# Patient Record
Sex: Male | Born: 1990 | Race: White | Hispanic: No | Marital: Single
Health system: Southern US, Community
[De-identification: ages and names within clinical notes are randomized; demographics above are authoritative.]

---

## 2017-03-06 ENCOUNTER — Emergency Department (HOSPITAL_COMMUNITY): Payer: Self-pay

## 2017-03-06 ENCOUNTER — Emergency Department (HOSPITAL_COMMUNITY)
Admission: EM | Admit: 2017-03-06 | Discharge: 2017-03-06 | Disposition: A | Discharge status: Home / Self Care | Payer: Self-pay | Attending: Emergency Medicine | Admitting: Emergency Medicine

## 2017-03-06 DIAGNOSIS — F17200 Nicotine dependence, unspecified, uncomplicated: Secondary | ICD-10-CM | POA: Insufficient documentation

## 2017-03-06 DIAGNOSIS — R0789 Other chest pain: Secondary | ICD-10-CM

## 2017-03-06 DIAGNOSIS — Z79899 Other long term (current) drug therapy: Secondary | ICD-10-CM | POA: Insufficient documentation

## 2017-03-06 LAB — TYPE AND SCREEN
ABO/RH(D): A POS
ANTIBODY SCREEN: NEGATIVE

## 2017-03-06 LAB — I-STAT TROPONIN, ED: TROPONIN I, POC: 0 ng/mL (ref 0.00–0.08)

## 2017-03-06 LAB — CBC
HCT: 47.1 % (ref 39.0–52.0)
HEMOGLOBIN: 17.3 g/dL — AB (ref 13.0–17.0)
MCH: 36.2 pg — AB (ref 26.0–34.0)
MCHC: 36.7 g/dL — ABNORMAL HIGH (ref 30.0–36.0)
MCV: 98.5 fL (ref 78.0–100.0)
Platelets: 227 10*3/uL (ref 150–400)
RBC: 4.78 MIL/uL (ref 4.22–5.81)
RDW: 11.3 % — ABNORMAL LOW (ref 11.5–15.5)
WBC: 7.4 10*3/uL (ref 4.0–10.5)

## 2017-03-06 LAB — BASIC METABOLIC PANEL
ANION GAP: 14 (ref 5–15)
BUN: <5 mg/dL — ABNORMAL LOW (ref 6–20)
CALCIUM: 9.8 mg/dL (ref 8.9–10.3)
CHLORIDE: 103 mmol/L (ref 101–111)
CO2: 19 mmol/L — AB (ref 22–32)
Creatinine, Ser: 0.75 mg/dL (ref 0.61–1.24)
GFR calc non Af Amer: >60 mL/min (ref >60)
Glucose, Bld: 109 mg/dL — ABNORMAL HIGH (ref 65–99)
Potassium: 3.7 mmol/L (ref 3.5–5.1)
Sodium: 136 mmol/L (ref 135–145)

## 2017-03-06 LAB — ABO/RH: ABO/RH(D): A POS

## 2017-03-06 NOTE — ED Triage Notes (Signed)
Pt states that he has had intermittent chest pain for over 1 week worsening since Saturday. Pt states that he had his last dink of alcohol on Saturday. Pt states that he drinks daily except for Saturday. Pt noted to have pressured speech and trembling his hands in triage. Pt also reports rectal bleeding that has been ongoing for months.

## 2017-03-06 NOTE — Discharge Instructions (Signed)
Call any of the numbers on these instructions to get a primary care physician. Your primary care physician can refer you to a specialist you may need to further investigate rectal bleeding and chest pain. We do not believe that you have a life-threatening cause of rectal bleeding or of chest pain. Blood count is adequate. Ask your primary care physician to help you to stop smoking. Your blood pressure should be rechecked within the next 3 weeks. Today's was elevated at 152/98. If you have an alcohol problem, get help. Call any of the numbers on the resource guide

## 2017-03-06 NOTE — ED Provider Notes (Signed)
[facility]-EMERGENCY DEPT Provider Note   CSN: [id number] Arrival date & time: 03/06/17  1334     History   Chief Complaint Chief Complaint  Patient presents with  . Chest Pain    HPI Jeff Carter is a 26 y.o. male.Complains of chest pain for 8 months. Pain is covering a 1 cm area lateral to the left nipple, nonradiating worse with lying on left side improved with other positions. No treatment prior to coming here no shortness of breath no nausea. Pain is nonexertional. No fever. He also reports rectal bleeding for several months with each stool. Red blood mixed with stool. He denies abdominal pain denies change in appetite. He presently feels well except for slightly nervous. He admits to drinking 2 alcoholic beverages per night 6 days per week. His last alcoholic intake was 2 days ago. No treatment prior to coming here. No other associated symptoms  HPI  No past medical history on file.  There are no active problems to display for this patient.   No past surgical history on file.     Home Medications    Prior to Admission medications   Medication Sig Start Date End Date Taking? Authorizing Provider  calcium carbonate (TUMS - DOSED IN MG ELEMENTAL CALCIUM) 500 MG chewable tablet Chew 2 tablets by mouth daily as needed for indigestion or heartburn.   Yes [provider]    Family History No family history on file.  Social History Social History  Substance Use Topics  . Smoking status: Not on file  . Smokeless tobacco: Not on file  . Alcohol use Not on file   Positive smoker drinks approximately 2 alcoholic beverages per night. No illicit drug use  Allergies   Patient has no known allergies.   Review of Systems Review of Systems  Constitutional: Negative.   HENT: Negative.   Respiratory: Negative.   Cardiovascular: Positive for chest pain.  Gastrointestinal: Positive for blood in stool.  Musculoskeletal: Negative.   Skin: Negative.   Neurological:  Negative.   Psychiatric/Behavioral: The patient is nervous/anxious.   All other systems reviewed and are negative.    Physical Exam Updated Vital Signs BP (!) 152/98   Pulse 94   Temp 98.2 F (36.8 C) (Oral)   Resp 12   SpO2 99%   Physical Exam  Constitutional: He appears well-developed and well-nourished.  HENT:  Head: Normocephalic and atraumatic.  Eyes: Pupils are equal, round, and reactive to light. Conjunctivae are normal.  Neck: Neck supple. No tracheal deviation present. No thyromegaly present.  Cardiovascular: Normal rate and regular rhythm.   No murmur heard. Pulmonary/Chest: Effort normal and breath sounds normal.  Abdominal: Soft. Bowel sounds are normal. He exhibits no distension. There is no tenderness.  Genitourinary: Rectum normal. Rectal exam shows guaiac negative stool.  Musculoskeletal: Normal range of motion. He exhibits no edema or tenderness.  Neurological: He is alert. Coordination normal.  Skin: Skin is warm and dry. No rash noted.  Psychiatric: He has a normal mood and affect.  Nursing note and vitals reviewed.    ED Treatments / Results  Labs (all labs ordered are listed, but only abnormal results are displayed) Labs Reviewed  BASIC METABOLIC PANEL - Abnormal; Notable for the following:       Result Value   CO2 19 (*)    Glucose, Bld 109 (*)    BUN <5 (*)    All other components within normal limits  CBC - Abnormal; Notable for the following:  Hemoglobin 17.3 (*)    MCH 36.2 (*)    MCHC 36.7 (*)    RDW 11.3 (*)    All other components within normal limits  I-STAT TROPONIN, ED  POC OCCULT BLOOD, ED  POC OCCULT BLOOD, ED  TYPE AND SCREEN  ABO/RH    EKG  EKG Interpretation  Date/Time:  Monday March 06 2017 14:14:24 EDT Ventricular Rate:  95 PR Interval:  134 QRS Duration: 92 QT Interval:  354 QTC Calculation: 444 R Axis:   9 Text Interpretation:  Normal sinus rhythm with sinus arrhythmia Septal infarct , age undetermined  Abnormal ECG No old tracing to compare Confirmed by Doug Sou [phone]) on 03/06/2017 4:22:50 PM      Results for orders placed or performed during the hospital encounter of 03/06/17  Basic metabolic panel  Result Value Ref Range   Sodium 136 135 - 145 mmol/L   Potassium 3.7 3.5 - 5.1 mmol/L   Chloride 103 101 - 111 mmol/L   CO2 19 (L) 22 - 32 mmol/L   Glucose, Bld 109 (H) 65 - 99 mg/dL   BUN <5 (L) 6 - 20 mg/dL   Creatinine, Ser 6.04 0.61 - 1.24 mg/dL   Calcium 9.8 8.9 - 54.0 mg/dL   GFR calc non Af Amer >60 >60 mL/min   GFR calc Af Amer >60 >60 mL/min   Anion gap 14 5 - 15  CBC  Result Value Ref Range   WBC 7.4 4.0 - 10.5 K/uL   RBC 4.78 4.22 - 5.81 MIL/uL   Hemoglobin 17.3 (H) 13.0 - 17.0 g/dL   HCT 98.1 19.1 - 47.8 %   MCV 98.5 78.0 - 100.0 fL   MCH 36.2 (H) 26.0 - 34.0 pg   MCHC 36.7 (H) 30.0 - 36.0 g/dL   RDW 29.5 (L) 62.1 - 30.8 %   Platelets 227 150 - 400 K/uL  I-stat troponin, ED  Result Value Ref Range   Troponin i, poc 0.00 0.00 - 0.08 ng/mL   Comment 3          Type and screen [facility]  Result Value Ref Range   ABO/RH(D) A POS    Antibody Screen NEG    Sample Expiration 03/09/2017   ABO/Rh  Result Value Ref Range   ABO/RH(D) A POS    Dg Chest 2 View  Result Date: 03/06/2017 CLINICAL DATA:  Chest pain EXAM: CHEST  2 VIEW COMPARISON:  None. FINDINGS: Hyperinflation. No focal pulmonary infiltrate or effusion. Normal cardiomediastinal silhouette. No pneumothorax. IMPRESSION: Hyperinflation without edema or infiltrate. Electronically Signed   By: Jasmine Pang M.D.   On: 03/06/2017 15:06   Chest x-ray viewed by me Radiology Dg Chest 2 View  Result Date: 03/06/2017 CLINICAL DATA:  Chest pain EXAM: CHEST  2 VIEW COMPARISON:  None. FINDINGS: Hyperinflation. No focal pulmonary infiltrate or effusion. Normal cardiomediastinal silhouette. No pneumothorax. IMPRESSION: Hyperinflation without edema or infiltrate. Electronically Signed   By: Jasmine Pang M.D.   On: 03/06/2017 15:06    Procedures Procedures (including critical care time)  Medications Ordered in ED Medications - No data to display   Initial Impression / Assessment and Plan / ED Course  I have reviewed the triage vital signs and the nursing notes.  Pertinent labs & imaging results that were available during my care of the patient were reviewed by me and considered in my medical decision making (see chart for details).     Patient does not feel  that he has an alcohol problem. He does not appear to be in active withdrawal. Plan he is given resource guide for sepsis abuse. Chest pain highly atypical for acute coronary syndrome. Heart score equals 1. He is referred to primary care. I counseled patient for 5 minutes on smoking cessation  Final Clinical Impressions(s) / ED Diagnoses  Diagnosis #1 atypical chest pain #2 rectal bleeding by history #3 tobacco abuse Final diagnoses:  None    New Prescriptions New Prescriptions   No medications on file     Doug Sou, MD 03/06/17 1659

## 2017-03-07 LAB — POC OCCULT BLOOD, ED: FECAL OCCULT BLD: NEGATIVE

## 2018-12-20 IMAGING — DX DG CHEST 2V
2 series · 2 of 2 positions shown · non-contrast
Comparison: None.

CLINICAL DATA: Chest pain

EXAM:
CHEST  2 VIEW

[chest pa]
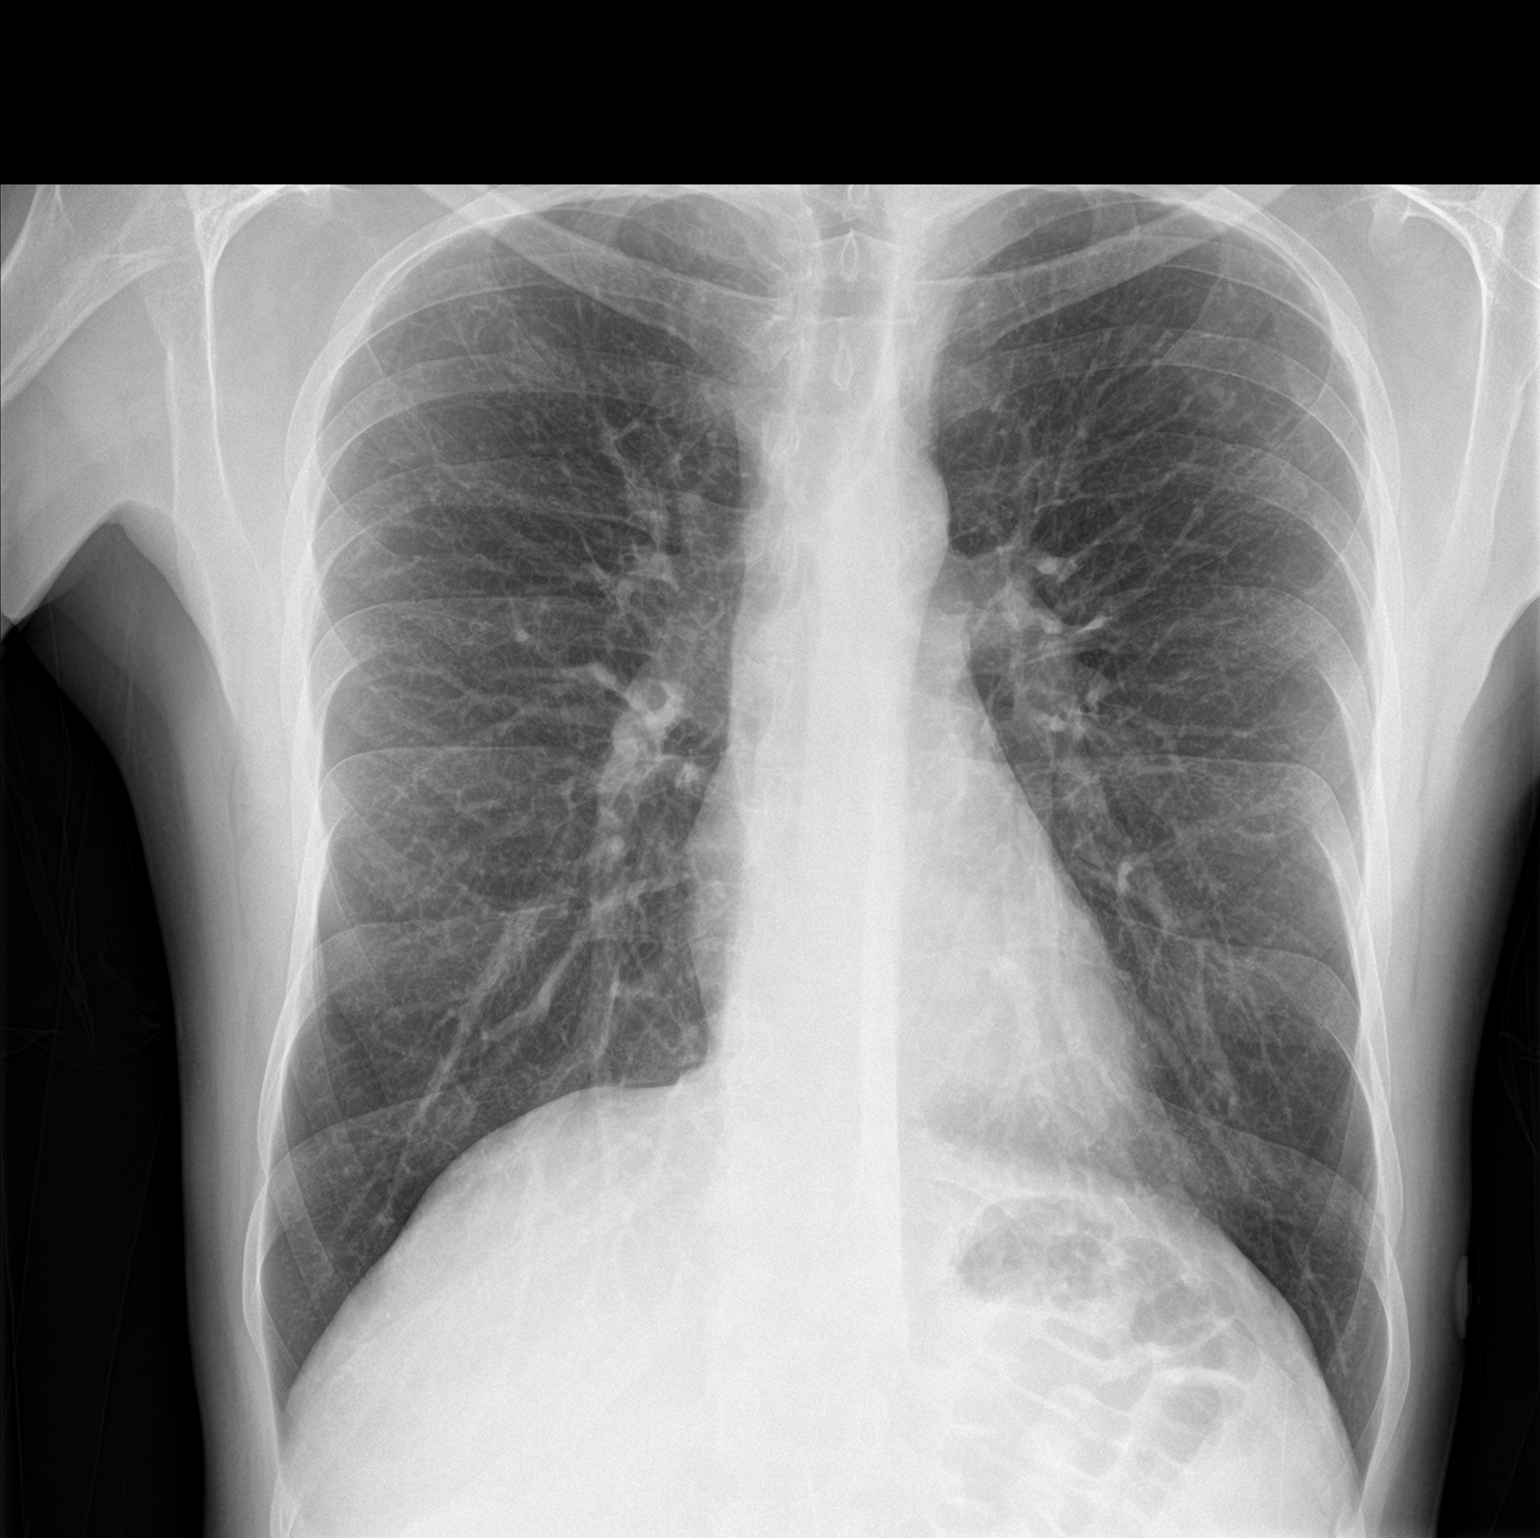

[chest lat]
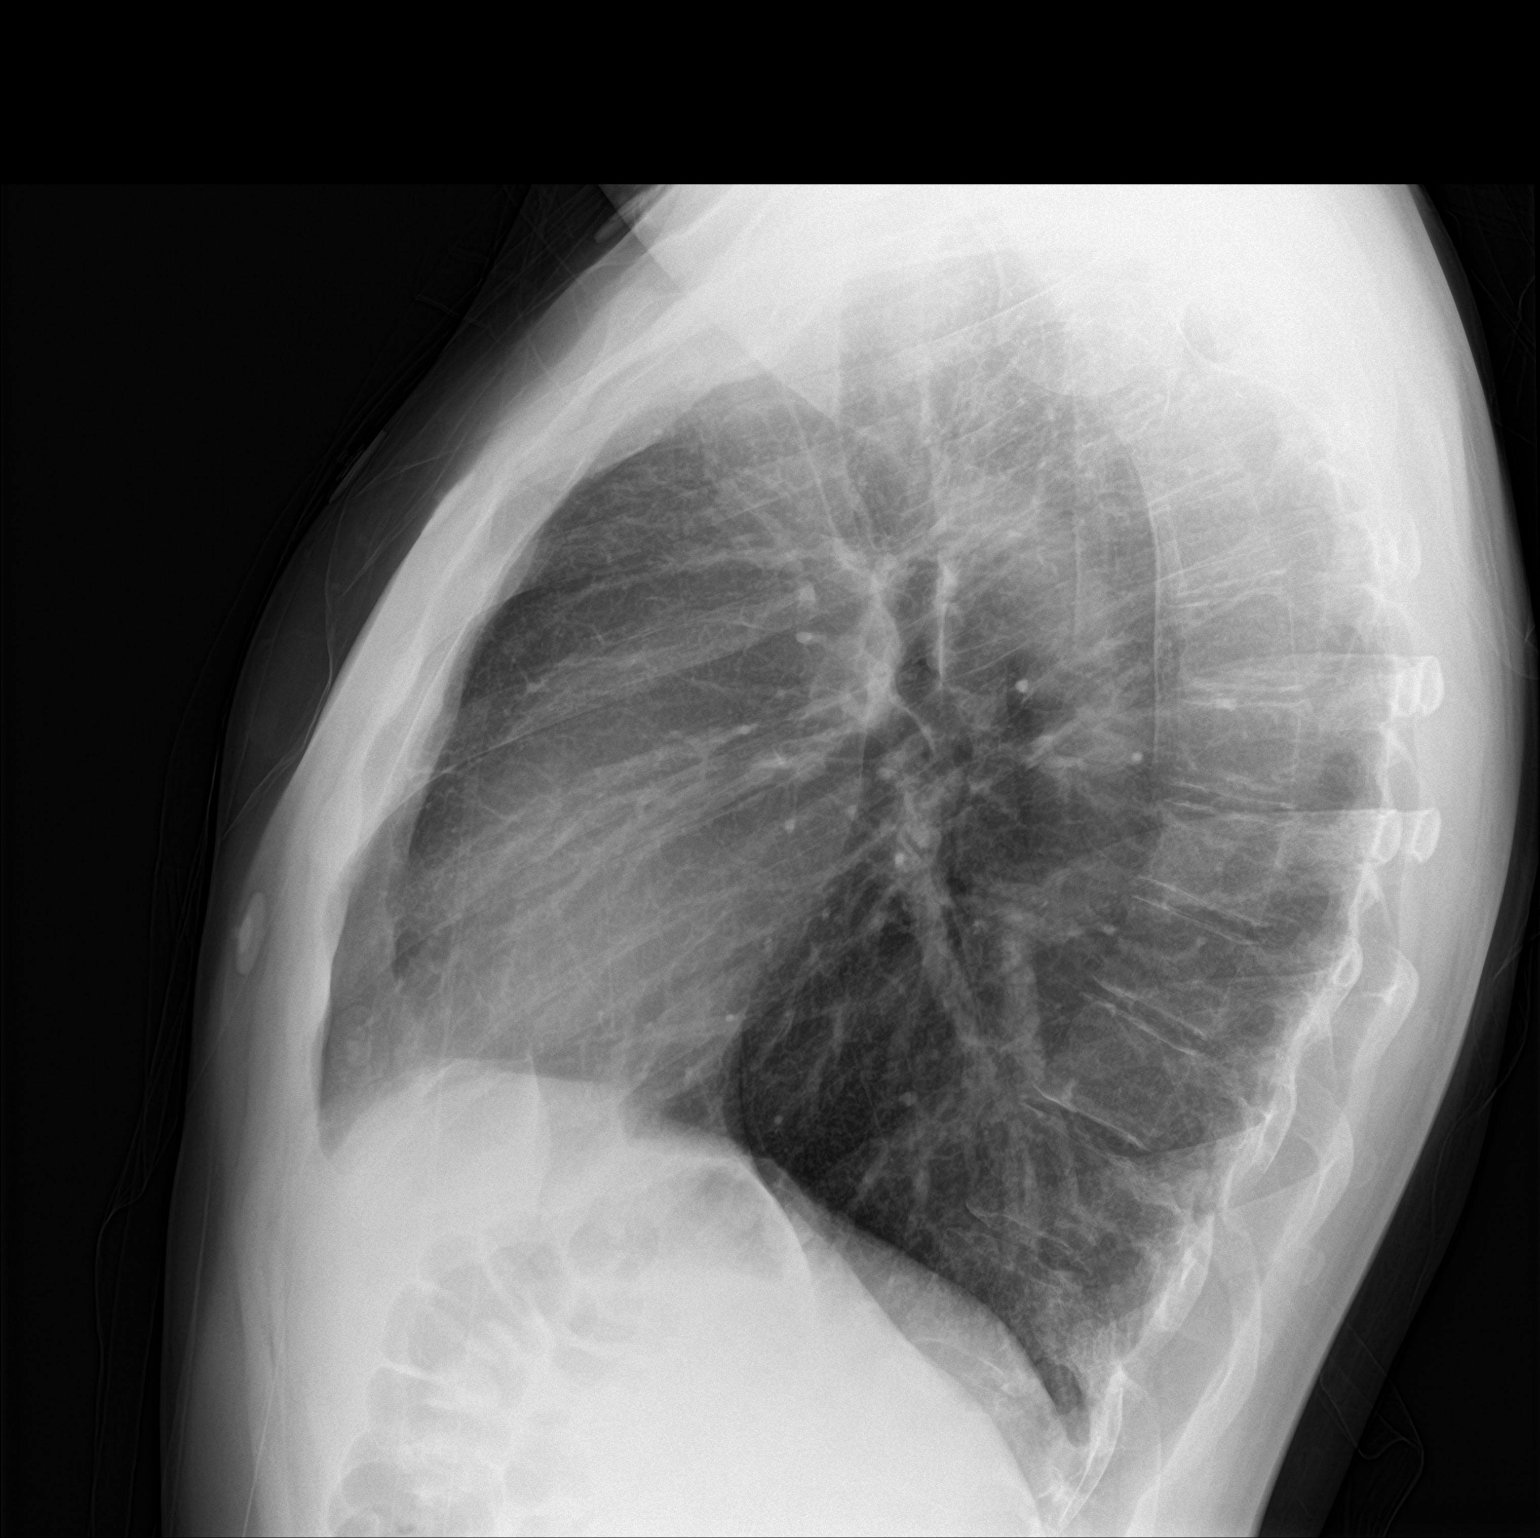

[2 of 2 positions shown; findings below may reference images not displayed]

FINDINGS: Hyperinflation. No focal pulmonary infiltrate or effusion. Normal
cardiomediastinal silhouette. No pneumothorax.
IMPRESSION: Hyperinflation without edema or infiltrate.
